# Patient Record
Sex: Female | Born: 1985 | Race: White | Hispanic: No | Marital: Married | Smoking: Current every day smoker
Health system: Southern US, Community
[De-identification: ages and names within clinical notes are randomized; demographics above are authoritative.]

## PROBLEM LIST (undated history)

## (undated) HISTORY — PX: OTHER SURGICAL HISTORY: SHX169

---

## 2016-11-20 ENCOUNTER — Emergency Department (HOSPITAL_COMMUNITY): Payer: No Typology Code available for payment source

## 2016-11-20 ENCOUNTER — Encounter (HOSPITAL_COMMUNITY): Payer: Self-pay | Admitting: Emergency Medicine

## 2016-11-20 DIAGNOSIS — R0789 Other chest pain: Secondary | ICD-10-CM | POA: Diagnosis not present

## 2016-11-20 DIAGNOSIS — R531 Weakness: Secondary | ICD-10-CM | POA: Insufficient documentation

## 2016-11-20 DIAGNOSIS — Z87891 Personal history of nicotine dependence: Secondary | ICD-10-CM | POA: Insufficient documentation

## 2016-11-20 DIAGNOSIS — R0602 Shortness of breath: Secondary | ICD-10-CM | POA: Diagnosis present

## 2016-11-20 DIAGNOSIS — M5412 Radiculopathy, cervical region: Secondary | ICD-10-CM | POA: Insufficient documentation

## 2016-11-20 NOTE — ED Triage Notes (Signed)
Pt states she was at [facility] about [facility] days ago and was diagnosed with Pneumonitis.Pt was sent home on prednisone. Pt reports not being able to use left arm and some swelling that she was already seen for. Pt reports SOB beginning last night and some facial swelling.

## 2016-11-21 ENCOUNTER — Emergency Department (HOSPITAL_COMMUNITY)
Admission: EM | Admit: 2016-11-21 | Discharge: 2016-11-21 | Disposition: A | Discharge status: Home / Self Care | Payer: No Typology Code available for payment source | Attending: Emergency Medicine | Admitting: Emergency Medicine

## 2016-11-21 ENCOUNTER — Encounter (HOSPITAL_COMMUNITY): Payer: Self-pay | Admitting: Radiology

## 2016-11-21 ENCOUNTER — Emergency Department (HOSPITAL_COMMUNITY): Payer: No Typology Code available for payment source

## 2016-11-21 DIAGNOSIS — M5412 Radiculopathy, cervical region: Secondary | ICD-10-CM

## 2016-11-21 DIAGNOSIS — R29898 Other symptoms and signs involving the musculoskeletal system: Secondary | ICD-10-CM

## 2016-11-21 DIAGNOSIS — R531 Weakness: Secondary | ICD-10-CM

## 2016-11-21 LAB — CBC
HCT: 43.5 % (ref 36.0–46.0)
Hemoglobin: 14.6 g/dL (ref 12.0–15.0)
MCH: 30.7 pg (ref 26.0–34.0)
MCHC: 33.6 g/dL (ref 30.0–36.0)
MCV: 91.6 fL (ref 78.0–100.0)
PLATELETS: 372 10*3/uL (ref 150–400)
RBC: 4.75 MIL/uL (ref 3.87–5.11)
RDW: 12.8 % (ref 11.5–15.5)
WBC: 19.2 10*3/uL — AB (ref 4.0–10.5)

## 2016-11-21 LAB — URINALYSIS, ROUTINE W REFLEX MICROSCOPIC
BACTERIA UA: NONE SEEN
BILIRUBIN URINE: NEGATIVE
Glucose, UA: NEGATIVE mg/dL
Hgb urine dipstick: NEGATIVE
KETONES UR: NEGATIVE mg/dL
Leukocytes, UA: NEGATIVE
Nitrite: POSITIVE — AB
PH: 7 (ref 5.0–8.0)
PROTEIN: NEGATIVE mg/dL
Specific Gravity, Urine: 1.01 (ref 1.005–1.030)

## 2016-11-21 LAB — I-STAT CHEM 8, ED
BUN: 14 mg/dL (ref 6–20)
CALCIUM ION: 1.08 mmol/L — AB (ref 1.15–1.40)
CHLORIDE: 104 mmol/L (ref 101–111)
Creatinine, Ser: 0.8 mg/dL (ref 0.44–1.00)
GLUCOSE: 102 mg/dL — AB (ref 65–99)
HCT: 45 % (ref 36.0–46.0)
Hemoglobin: 15.3 g/dL — ABNORMAL HIGH (ref 12.0–15.0)
Potassium: 5 mmol/L (ref 3.5–5.1)
Sodium: 140 mmol/L (ref 135–145)
TCO2: 28 mmol/L (ref 0–100)

## 2016-11-21 LAB — DIFFERENTIAL
BASOS PCT: 0 %
Basophils Absolute: 0 10*3/uL (ref 0.0–0.1)
EOS ABS: 0.1 10*3/uL (ref 0.0–0.7)
Eosinophils Relative: 0 %
Lymphocytes Relative: 25 %
Lymphs Abs: 4.9 10*3/uL — ABNORMAL HIGH (ref 0.7–4.0)
MONO ABS: 2.1 10*3/uL — AB (ref 0.1–1.0)
MONOS PCT: 11 %
Neutro Abs: 12.1 10*3/uL — ABNORMAL HIGH (ref 1.7–7.7)
Neutrophils Relative %: 64 %

## 2016-11-21 LAB — COMPREHENSIVE METABOLIC PANEL
ALT: 14 U/L (ref 14–54)
ANION GAP: 8 (ref 5–15)
AST: 13 U/L — ABNORMAL LOW (ref 15–41)
Albumin: 4.3 g/dL (ref 3.5–5.0)
Alkaline Phosphatase: 52 U/L (ref 38–126)
BUN: 11 mg/dL (ref 6–20)
CHLORIDE: 106 mmol/L (ref 101–111)
CO2: 24 mmol/L (ref 22–32)
Calcium: 8.9 mg/dL (ref 8.9–10.3)
Creatinine, Ser: 0.67 mg/dL (ref 0.44–1.00)
Glucose, Bld: 102 mg/dL — ABNORMAL HIGH (ref 65–99)
POTASSIUM: 3.6 mmol/L (ref 3.5–5.1)
SODIUM: 138 mmol/L (ref 135–145)
Total Bilirubin: 0.5 mg/dL (ref 0.3–1.2)
Total Protein: 7.4 g/dL (ref 6.5–8.1)

## 2016-11-21 LAB — I-STAT BETA HCG BLOOD, ED (MC, WL, AP ONLY): I-stat hCG, quantitative: <5 m[IU]/mL (ref <5)

## 2016-11-21 LAB — RAPID URINE DRUG SCREEN, HOSP PERFORMED
Amphetamines: NOT DETECTED
BENZODIAZEPINES: NOT DETECTED
Barbiturates: NOT DETECTED
COCAINE: NOT DETECTED
OPIATES: NOT DETECTED
Tetrahydrocannabinol: NOT DETECTED

## 2016-11-21 LAB — PROTIME-INR
INR: 0.97
PROTHROMBIN TIME: 12.9 s (ref 11.4–15.2)

## 2016-11-21 LAB — I-STAT TROPONIN, ED: TROPONIN I, POC: 0 ng/mL (ref 0.00–0.08)

## 2016-11-21 LAB — ETHANOL: Alcohol, Ethyl (B): <5 mg/dL (ref <5)

## 2016-11-21 LAB — APTT: APTT: 24 s (ref 24–36)

## 2016-11-21 MED ORDER — PREDNISONE 20 MG PO TABS: ORAL_TABLET | ORAL | 0 refills | Status: DC

## 2016-11-21 MED ORDER — NAPROXEN 500 MG PO TABS: 500.0000 mg | ORAL_TABLET | Freq: Two times a day (BID) | ORAL | 0 refills | Status: AC

## 2016-11-21 MED ORDER — LORAZEPAM 2 MG/ML IJ SOLN
1.0000 mg | Freq: Once | INTRAMUSCULAR | Status: AC
  Administered 2016-11-21: 1 mg via INTRAVENOUS
  Filled 2016-11-21: qty 1

## 2016-11-21 MED ORDER — OXYCODONE-ACETAMINOPHEN 5-325 MG PO TABS: 1.0000 | ORAL_TABLET | ORAL | 0 refills | Status: AC | PRN

## 2016-11-21 MED ORDER — IOPAMIDOL (ISOVUE-370) INJECTION 76%
100.0000 mL | Freq: Once | INTRAVENOUS | Status: AC | PRN
  Administered 2016-11-21: 100 mL via INTRAVENOUS

## 2016-11-21 MED ORDER — IOPAMIDOL (ISOVUE-370) INJECTION 76%
INTRAVENOUS | Status: AC
  Filled 2016-11-21: qty 100

## 2016-11-21 MED ORDER — GADOBENATE DIMEGLUMINE 529 MG/ML IV SOLN
20.0000 mL | Freq: Once | INTRAVENOUS | Status: AC | PRN
  Administered 2016-11-21: 20 mL via INTRAVENOUS

## 2016-11-21 NOTE — ED Notes (Signed)
Pt reports that she is c/o left sided chest pain and left side arm numbness that began on 11/17/16. Pt does report some SOB with Chest discomfort, and reports recently being dx with Pneumonia

## 2016-11-21 NOTE — ED Notes (Signed)
A Harris, PA, in w/pt and spouse.

## 2016-11-21 NOTE — ED Provider Notes (Signed)
Patient transferred [facility] for further evaluation of left upper extremity weakness after normal CT scan. Patient initially presented with complaints of shortness of breath.  She had associated left-sided neck pain described as cramping. 4 days ago she was seen around the hospital and diagnosed with pneumonitis. She was prescribed prednisone at that time which she reports taking as directed. She reports persistent symptoms and movement of her left upper chin when he increases pain. Patient denies trauma or falls.  Patient was evaluated by Dr. Mora Bellmanni who consulted with Dr. Roseanne RenoStewart who recommended emergent MRI tonight of her C-spine.  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 Patient will need admission for potential IVIG but will need MRI first to look for lesions.  Physical Exam  BP 128/90   Pulse 73   Temp 97.8 F (36.6 C)   Resp 13   Ht 5\' 8"  (1.727 m)   Wt 103.9 kg   LMP 11/15/2016   SpO2 98%   BMI 34.82 kg/m   Physical Exam  Constitutional: She appears well-developed and well-nourished. No distress.  HENT:  Head: Normocephalic.  Eyes: Conjunctivae are normal. No scleral icterus.  Neck: Normal range of motion.  Cardiovascular: Normal rate and intact distal pulses.   Pulmonary/Chest: Effort normal.  Musculoskeletal: Normal range of motion.  Mild edema of the left hand and forearm Significantly decreased range of motion of the left shoulder, elbow, wrist and fingers along with significant decreased strength  Neurological: She is alert.  Skin: Skin is warm and dry.    ED Course  Procedures  Clinical Course      MDM  Patient presents with acute worsening of her left arm weakness. She is barely able to move the arm on her own. She is pending emergent MRI of the C-spine to look for lesions.  She is currently nothing by mouth. Patient will need admission after MRI.  At shift change care was transferred to Huntley DecAbi Harris, PA-C who will follow pending studies,  re-evaulate and admit.          Dahlia ClientHannah Lianna Sitzmann, PA-C 11/21/16 [id number]    Derwood KaplanAnkit Nanavati, MD 11/21/16 [phone]

## 2016-11-21 NOTE — ED Notes (Signed)
Lab informed that new blue top is needed due to clotting.  Advised RN.

## 2016-11-21 NOTE — ED Notes (Signed)
[facility] here to [facility] for further evaluation and MRI

## 2016-11-21 NOTE — ED Provider Notes (Signed)
7:23 AM BP 110/86   Pulse (!) 59   Temp 97.7 F (36.5 C) (Oral)   Resp 16   Ht 5\' 8"  (1.727 m)   Wt 103.9 kg   LMP 11/15/2016   SpO2 96%   BMI 34.82 kg/m  Patient taken in sign out from PA Muthersbaugh. Patient recently diagnosed with pneumonitis. [facility] acute loss of function of her left arm. CTs negative. Case discussed with Dr. Roseanne RenoStewart of Neurology who request MRI w contrast of the cspine to r/o potential autoimmune  Brachial plexus abnormality for which she would require IVIG. Patient will NEED ADMISSION regardless of the findings, however the findings will direct mgmt. Medicine admission and Neuro consult.  I evaluated the patient along with Dr Denton LankSteinl The patient does not have flaccid paralysis of the left arm. When I woke the patient from sleep. She wiped her headache. I with her left hand. Patient also used her left arm and right arm to push herself up to seated position. Asked for strength evaluation. The patient is very poor effort, however, if you lift her arm and told her to hold it up. The patient is able to utilize the arm normally. However, she complains of pain. Patient's strength is closer to 3 or 4 out of 5. Her MRI of the spine shows a left sided cervical compression at C5-C6 nerve root. I discussed the case with Dr. 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 Otelia LimesLindzen also feels comfortable with this diagnosis. In further questioning of the patient. She states that when this first started, she felt as if she could not move her arm, but after several days of steroid use. Patient has had improvement in motor function. Patient feels that if she could just control her pain. She would be okay. I discussed the case also with Dr. Conchita ParisNundkumar agrees with the assessment. He asked for a two-week steroid taper along with NSAIDs. I'll also give the  patient is short amount of narcotic pain medicine, given her significant radicular pain. Patient is comfortable with the plan of care at this time and will be discharged with these medications and a follow-up with neurosurgery  Patient seen in shared visit with attending physician, Cathren LaineKevin Steinl, MD Who agrees with assessment, work up , treatment, and plan for discharge. I have answered all questions to the best of my ability and discussed with her return precautions with the patient.     Arthor Captainbigail Mkayla Steele, PA-C 11/21/16 1616    Cathren LaineKevin Steinl, MD 11/24/16 [phone]

## 2016-11-21 NOTE — ED Notes (Signed)
Pt transported to MRI 

## 2016-11-21 NOTE — Discharge Instructions (Signed)
Get help right away if: Your pain gets much worse and cannot be controlled with medicines. You have weakness or numbness in your hand, arm, face, or leg. You have a high fever. You have a stiff, rigid neck. You lose control of your bowels or your bladder (have incontinence). You have trouble with walking, balance, or speaking. 

## 2016-11-21 NOTE — ED Notes (Signed)
Pt and significant other lying on stretcher. Woke pt to advise transporter ready to transport her to MRI.

## 2016-11-21 NOTE — ED Provider Notes (Signed)
[facility]-EMERGENCY DEPT Provider Note    By signing my name below, I, Earmon Phoenix, attest that this documentation has been prepared under the direction and in the presence of Tomasita Crumble, MD. Electronically Signed: Earmon Phoenix, ED Scribe. 11/21/16. 2:47 AM.    History   Chief Complaint Chief Complaint  Patient presents with  . Shortness of Breath    The history is provided by the patient and medical records. No language interpreter was used.    HPI Comments:  Roberta Hughes is an obese 30 y.o. female who presents to the Emergency Department complaining of SOB that began last night. She reports associated LUE pain, swelling and facial swelling. Pt reports her symptoms started with gradually worsening left sided neck pain she describes as cramping. She states she was seen at [facility] four days ago and was diagnosed with pneumonitis and was prescribed Prednisone which she reports taking as directed. Pt reports the steroids are not helping and she is still having difficulty moving her LUE. Moving the LUE increases her pain. She denies fever, chills, nausea, vomiting, diarrhea, difficulty urinating. She denies any trauma, injury or fall.    History reviewed. No pertinent past medical history.  There are no active problems to display for this patient.   Past Surgical History:  Procedure Laterality Date  . arm surgery Left    Nerve blockage    OB History    No data available       Home Medications    Prior to Admission medications   Medication Sig Start Date End Date Taking? Authorizing Provider  ibuprofen (ADVIL,MOTRIN) 200 MG tablet Take 400 mg by mouth every 6 (six) hours as needed for headache, mild pain or moderate pain.   Yes Historical Provider, MD  predniSONE (DELTASONE) 50 MG tablet Take 50 mg by mouth daily with breakfast.   Yes Historical Provider, MD    Family History No family history on file.  Social History Social History  Substance Use Topics    . Smoking status: Former Smoker    Quit date: 11/16/2016  . Smokeless tobacco: Never Used  . Alcohol use Not on file     Allergies   Patient has no known allergies.   Review of Systems Review of Systems A complete 10 system review of systems was obtained and all systems are negative except as noted in the HPI and PMH.    Physical Exam Updated Vital Signs BP 144/90 (BP Location: Left Arm)   Pulse 82   Temp 97.8 F (36.6 C) (Oral)   Resp 18   Ht 5\' 8"  (1.727 m)   Wt 229 lb (103.9 kg)   LMP 11/15/2016   SpO2 100%   BMI 34.82 kg/m   Physical Exam  Constitutional: She is oriented to person, place, and time. She appears well-developed and well-nourished. No distress.  HENT:  Head: Normocephalic and atraumatic.  Nose: Nose normal.  Mouth/Throat: Oropharynx is clear and moist. No oropharyngeal exudate.  Eyes: Conjunctivae and EOM are normal. Pupils are equal, round, and reactive to light. No scleral icterus.  Neck: Normal range of motion. Neck supple. No JVD present. No tracheal deviation present. No thyromegaly present.  Cardiovascular: Normal rate, regular rhythm and normal heart sounds.  Exam reveals no gallop and no friction rub.   No murmur heard. Pulmonary/Chest: Effort normal and breath sounds normal. No respiratory distress. She has no wheezes. She exhibits no tenderness.  Abdominal: Soft. Bowel sounds are normal. She exhibits no distension and no  mass. There is no tenderness. There is no rebound and no guarding.  Musculoskeletal: She exhibits tenderness. She exhibits no edema.  Tenderness to palpation over left neck and left anterior chest.  Lymphadenopathy:    She has no cervical adenopathy.  Neurological: She is alert and oriented to person, place, and time. No cranial nerve deficit. She exhibits normal muscle tone.  LUE 1/5 strength. 5/5 strength in all other extremities. Normal sensations. Slurred speech.  Skin: Skin is warm and dry. No rash noted. No erythema.  No pallor.  Nursing note and vitals reviewed.    ED Treatments / Results  DIAGNOSTIC STUDIES: Oxygen Saturation is 100% on RA, normal by my interpretation.   COORDINATION OF CARE: 12:21 AM- Will order labs and imaging. Pt verbalizes understanding and agrees to plan.  Medications  iopamidol (ISOVUE-370) 76 % injection (not administered)  iopamidol (ISOVUE-370) 76 % injection 100 mL (100 mLs Intravenous Contrast Given 11/21/16 0148)    Labs (all labs ordered are listed, but only abnormal results are displayed) Labs Reviewed  CBC - Abnormal; Notable for the following:       Result Value   WBC 19.2 (*)    All other components within normal limits  DIFFERENTIAL - Abnormal; Notable for the following:    Neutro Abs 12.1 (*)    Lymphs Abs 4.9 (*)    Monocytes Absolute 2.1 (*)    All other components within normal limits  COMPREHENSIVE METABOLIC PANEL - Abnormal; Notable for the following:    Glucose, Bld 102 (*)    AST 13 (*)    All other components within normal limits  URINALYSIS, ROUTINE W REFLEX MICROSCOPIC - Abnormal; Notable for the following:    APPearance HAZY (*)    Nitrite POSITIVE (*)    Squamous Epithelial / LPF 0-5 (*)    All other components within normal limits  I-STAT CHEM 8, ED - Abnormal; Notable for the following:    Glucose, Bld 102 (*)    Calcium, Ion 1.08 (*)    Hemoglobin 15.3 (*)    All other components within normal limits  ETHANOL  RAPID URINE DRUG SCREEN, HOSP PERFORMED  PROTIME-INR  APTT  I-STAT TROPOININ, ED  I-STAT BETA HCG BLOOD, ED ([facility], [facility], [facility] ONLY)    EKG  EKG Interpretation  Date/Time:  Saturday November 20 2016 19:43:53 EST Ventricular Rate:  74 PR Interval:    QRS Duration: 81 QT Interval:  366 QTC Calculation: 406 R Axis:   46 Text Interpretation:  Sinus rhythm normal, no old comparison Confirmed by Donnald GarrePfeiffer, MD, Lebron ConnersMarcy [phone]([id number]) on 11/20/2016 8:58:59 PM       Radiology Dg Chest 2 View  Result Date: 11/20/2016 CLINICAL  DATA:  Recent diagnosis of pneumonia. Shortness of breath which is worsening. Facial swelling. EXAM: CHEST  2 VIEW COMPARISON:  None. FINDINGS: The heart size and mediastinal contours are within normal limits. Both lungs are clear. The visualized skeletal structures are unremarkable. IMPRESSION: No active cardiopulmonary disease. Electronically Signed   By: Paulina FusiMark  Shogry M.D.   On: 11/20/2016 20:00   Ct Head Wo Contrast  Result Date: 11/21/2016 CLINICAL DATA:  Shortness of breath and facial swelling beginning last night, LEFT neck cramping. LEFT upper extremity weakness. EXAM: CT HEAD WITHOUT CONTRAST TECHNIQUE: Contiguous axial images were obtained from the base of the skull through the vertex without intravenous contrast. COMPARISON:  None. FINDINGS: BRAIN: The ventricles and sulci are normal. No intraparenchymal hemorrhage, mass effect nor midline shift. No acute large  vascular territory infarcts. No abnormal extra-axial fluid collections. Basal cisterns are patent. VASCULAR: Unremarkable. SKULL/SOFT TISSUES: No skull fracture. No significant soft tissue swelling. ORBITS/SINUSES: The included ocular globes and orbital contents are normal.Severe RIGHT maxillary sinusitis without antral expansion. OTHER: None. IMPRESSION: Normal CT HEAD. Severe RIGHT maxillary sinusitis. Electronically Signed   By: Awilda Metro M.D.   On: 11/21/2016 01:02   Ct Angio Neck W Or Wo Contrast  Result Date: 11/21/2016 CLINICAL DATA:  LEFT neck pain and LEFT upper extremity weakness. EXAM: CT ANGIOGRAPHY NECK TECHNIQUE: Multidetector CT imaging of the neck was performed using the standard protocol during bolus administration of intravenous contrast. Multiplanar CT image reconstructions and MIPs were obtained to evaluate the vascular anatomy. Carotid stenosis measurements (when applicable) are obtained utilizing NASCET criteria, using the distal internal carotid diameter as the denominator. CONTRAST:  100 cc Isovue 370  COMPARISON:  None. FINDINGS: AORTIC ARCH: Normal appearance of the thoracic arch, normal branch pattern. The origins of the innominate, left Common carotid artery and subclavian artery are widely patent. RIGHT CAROTID SYSTEM: Common carotid artery is widely patent, coursing in a straight line fashion. Normal appearance of the carotid bifurcation without hemodynamically significant stenosis by NASCET criteria. Normal appearance of the included internal carotid artery. LEFT CAROTID SYSTEM: Common carotid artery is widely patent, coursing in a straight line fashion. Normal appearance of the carotid bifurcation without hemodynamically significant stenosis by NASCET criteria. Normal appearance of the included internal carotid artery. VERTEBRAL ARTERIES:Codominant vertebral artery's. Normal appearance of the vertebral arteries, which appear widely patent. RIGHT vertebral artery remains extraforaminal dishes C4-5, normal variant. SKELETON: No acute osseous process though bone windows have not been submitted. No degenerative change of the cervical spine. OTHER NECK: Soft tissues of the neck are nonacute though, not tailored for evaluation. Please see CT chest report from same day, reported separately for dedicated findings. 16 mm LEFT thyroid nodule. IMPRESSION: Normal CTA NECK. **An incidental finding of potential clinical significance has been found. 19 mm LEFT thyroid nodule for which nonemergent thyroid sonogram is recommended. This follows ACR consensus guidelines: Managing Incidental Thyroid Nodules Detected on Imaging: White Paper of the ACR Incidental Thyroid Findings Committee. J Am Coll Radiol 2015; 12:143-150.** Electronically Signed   By: Awilda Metro M.D.   On: 11/21/2016 02:19   Ct Chest W Contrast  Result Date: 11/21/2016 CLINICAL DATA:  Neck pain and left upper extremity weakness. EXAM: CT CHEST WITH CONTRAST TECHNIQUE: Multidetector CT imaging of the chest was performed during intravenous contrast  administration. CONTRAST:  100 cc Isovue 370, contrast bolus utilized for both neck CTA and chest CT. COMPARISON:  Chest CT 4 days prior 11/17/2016 performed at [facility]. FINDINGS: Cardiovascular: Borderline cardiomegaly again seen. No evidence of aortic dissection allowing for motion. No pericardial fluid. Mediastinum/Nodes: The bilateral hilar adenopathy on prior exam is not as well visualized, due to phase of contrast bolus timing. Persistent prominent right hilar lymph node measures 11 mm. The left hilar lymph nodes on prior exam are not as well visualized. Small subcarinal lymph node is seen. Left thyroid nodule, as reported previously. Lungs/Pleura: Diminished left upper lobe ground-glass opacity from prior exam. Minimal residual right middle lobe atelectasis or scarring. The lungs are otherwise clear. No pleural effusion. Upper Abdomen: No acute abnormality. Musculoskeletal: There are no acute or suspicious osseous abnormalities. IMPRESSION: 1. Resolved left upper lobe ground-glass opacity consistent with improved pneumonitis. 2. Mild persistent right hilar adenopathy. Prominent left hilar lymph nodes on prior exam are not as  well visualized, may have resolved or just not seen due to phase of contrast. Electronically Signed   By: Rubye OaksMelanie  Ehinger M.D.   On: 11/21/2016 02:33    Procedures Procedures (including critical care time)  Medications Ordered in ED Medications  iopamidol (ISOVUE-370) 76 % injection (not administered)  iopamidol (ISOVUE-370) 76 % injection 100 mL (100 mLs Intravenous Contrast Given 11/21/16 0148)     Initial Impression / Assessment and Plan / ED Course  I have reviewed the triage vital signs and the nursing notes.  Pertinent labs & imaging results that were available during my care of the patient were reviewed by me and considered in my medical decision making (see chart for details).  Clinical Course    Patient presents to the ED for numbness and weakness  of the LUE.  Also has pain in the L chest and neck.  This has been going on for 2-3 days.  Concerning for mass in chest or neck arterial dissection.  Will investigate with CT.  Patient will need admission for further management.   3:33 AM Labs and CT normal and do not show cause of weakness.  She continues to be weak in that extremity.  I consulted with Dr. Roseanne RenoStewart who recommends the patient get an MRI of the c spine tonight [facility]. She will likely require admission for further care after MRI.  Dr. Adela LankFloyd accepts to [facility].   I personally performed the services described in this documentation, which was scribed in my presence. The recorded information has been reviewed and is accurate.     Final Clinical Impressions(s) / ED Diagnoses   Final diagnoses:  None    New Prescriptions New Prescriptions   No medications on file     Tomasita CrumbleAdeleke De Libman, MD 11/21/16 [id number]

## 2017-03-14 ENCOUNTER — Emergency Department (HOSPITAL_COMMUNITY)
Admission: EM | Admit: 2017-03-14 | Discharge: 2017-03-14 | Disposition: A | Discharge status: Home / Self Care | Payer: No Typology Code available for payment source | Attending: Emergency Medicine | Admitting: Emergency Medicine

## 2017-03-14 ENCOUNTER — Encounter (HOSPITAL_COMMUNITY): Payer: Self-pay

## 2017-03-14 DIAGNOSIS — N939 Abnormal uterine and vaginal bleeding, unspecified: Secondary | ICD-10-CM | POA: Diagnosis present

## 2017-03-14 DIAGNOSIS — F17200 Nicotine dependence, unspecified, uncomplicated: Secondary | ICD-10-CM | POA: Insufficient documentation

## 2017-03-14 LAB — CBC WITH DIFFERENTIAL/PLATELET
Basophils Absolute: 0.1 10*3/uL (ref 0.0–0.1)
Basophils Relative: 1 %
Eosinophils Absolute: 0.3 10*3/uL (ref 0.0–0.7)
Eosinophils Relative: 4 %
HEMATOCRIT: 37.3 % (ref 36.0–46.0)
HEMOGLOBIN: 12.1 g/dL (ref 12.0–15.0)
LYMPHS PCT: 30 %
Lymphs Abs: 2.5 10*3/uL (ref 0.7–4.0)
MCH: 30.2 pg (ref 26.0–34.0)
MCHC: 32.4 g/dL (ref 30.0–36.0)
MCV: 93 fL (ref 78.0–100.0)
MONOS PCT: 9 %
Monocytes Absolute: 0.8 10*3/uL (ref 0.1–1.0)
NEUTROS ABS: 4.9 10*3/uL (ref 1.7–7.7)
NEUTROS PCT: 56 %
Platelets: 293 10*3/uL (ref 150–400)
RBC: 4.01 MIL/uL (ref 3.87–5.11)
RDW: 12.9 % (ref 11.5–15.5)
WBC: 8.6 10*3/uL (ref 4.0–10.5)

## 2017-03-14 LAB — COMPREHENSIVE METABOLIC PANEL
ALBUMIN: 3.7 g/dL (ref 3.5–5.0)
ALK PHOS: 43 U/L (ref 38–126)
ALT: 19 U/L (ref 14–54)
ANION GAP: 7 (ref 5–15)
AST: 18 U/L (ref 15–41)
BILIRUBIN TOTAL: 0.3 mg/dL (ref 0.3–1.2)
BUN: 8 mg/dL (ref 6–20)
CALCIUM: 9.2 mg/dL (ref 8.9–10.3)
CO2: 27 mmol/L (ref 22–32)
Chloride: 105 mmol/L (ref 101–111)
Creatinine, Ser: 0.77 mg/dL (ref 0.44–1.00)
GFR calc Af Amer: >60 mL/min (ref >60)
GLUCOSE: 83 mg/dL (ref 65–99)
POTASSIUM: 4.2 mmol/L (ref 3.5–5.1)
Sodium: 139 mmol/L (ref 135–145)
TOTAL PROTEIN: 5.9 g/dL — AB (ref 6.5–8.1)

## 2017-03-14 LAB — LIPASE, BLOOD: LIPASE: 36 U/L (ref 11–51)

## 2017-03-14 LAB — I-STAT BETA HCG BLOOD, ED (MC, WL, AP ONLY): I-stat hCG, quantitative: <5 m[IU]/mL (ref <5)

## 2017-03-14 NOTE — ED Triage Notes (Signed)
Pt states she has lower abdominal pain and heavy vaginal bleeding X6 days. LMP 02/03/17. She reports she is passing quarter size clots and is having to change her pad X2 per hour.

## 2017-03-14 NOTE — Discharge Instructions (Signed)
You may continue taking Tylenol and/or ibuprofen as prescribed over-the-counter as needed for pain relief. Continue draining fluids at home to remain hydrated. I recommend following up with the women's clinic listed below this week for further management and evaluation of your abnormal vaginal bleeding. Please return to the Emergency Department if symptoms worsen or new onset of fever, chest pain, difficulty breathing, new/worsening abdominal pain, vomiting, worsening vaginal bleeding, headache, lightheadedness, dizziness, numbness, weakness, syncope.

## 2017-03-14 NOTE — ED Provider Notes (Signed)
[facility]-EMERGENCY DEPT Provider Note   CSN: [id number] Arrival date & time: 03/14/17  1047     History   Chief Complaint Chief Complaint  Patient presents with  . Vaginal Bleeding    HPI Roberta Hughes is a 31 y.o. female.  HPI   Patient is a 31 year old female with no pertinent past medical history presents the ED with complaint of vaginal bleeding, onset 6 days. Patient reports having constant heavy vaginal bleeding for the past 6 days, endorses passing multiple clots daily. She states she is having to change her pad every 30-60 minutes. She endorses having associated waxing and waning lower abdominal sharp pain which started 2 days ago. Denies any aggravating or alleviating factors. She states she has been taking ibuprofen at home without relief. Patient reports history of irregular menstrual cycles but notes her last menstrual period was on 02/03/17. Denies currently being on any form of birth control. Denies fever, chills, headache, lightheadedness, shortness of breath, chest pain, nausea, vomiting, diarrhea, urinary symptoms, vaginal discharge, numbness, weakness, syncope. Denies abdominal surgical history.  History reviewed. No pertinent past medical history.  There are no active problems to display for this patient.   Past Surgical History:  Procedure Laterality Date  . arm surgery Left    Nerve blockage    OB History    No data available       Home Medications    Prior to Admission medications   Medication Sig Start Date End Date Taking? Authorizing Provider  ibuprofen (ADVIL,MOTRIN) 200 MG tablet Take 400 mg by mouth every 6 (six) hours as needed for headache, mild pain or moderate pain.    Historical Provider, MD  naproxen (NAPROSYN) 500 MG tablet Take 1 tablet (500 mg total) by mouth 2 (two) times daily. 11/21/16   Arthor Captain, PA-C  oxyCODONE-acetaminophen (PERCOCET) 5-325 MG tablet Take 1 tablet by mouth every 4 (four) hours as needed. 11/21/16   Arthor Captain,  PA-C  predniSONE (DELTASONE) 20 MG tablet 3 tabs po daily x 3 days, then 2 tabs x 3 days, then 1.5 tabs x 3 days, then 1 tab x 3 days, then 0.5 tabs x 3 days 11/21/16   Arthor Captain, PA-C    Family History History reviewed. No pertinent family history.  Social History Social History  Substance Use Topics  . Smoking status: Current Every Day Smoker    Packs/day: 0.50    Last attempt to quit: 11/16/2016  . Smokeless tobacco: Never Used  . Alcohol use Yes     Comment: occ     Allergies   Patient has no known allergies.   Review of Systems Review of Systems  Gastrointestinal: Positive for abdominal pain.  Genitourinary: Positive for vaginal bleeding.  All other systems reviewed and are negative.    Physical Exam Updated Vital Signs BP 113/78   Pulse 87   Temp 98.4 F (36.9 C) (Oral)   Resp 18   SpO2 98%   Physical Exam  Constitutional: She is oriented to person, place, and time. She appears well-developed and well-nourished. No distress.  HENT:  Head: Normocephalic and atraumatic.  Mouth/Throat: Oropharynx is clear and moist. No oropharyngeal exudate.  Eyes: Conjunctivae and EOM are normal. Right eye exhibits no discharge. Left eye exhibits no discharge. No scleral icterus.  Neck: Normal range of motion. Neck supple.  Cardiovascular: Normal rate, regular rhythm, normal heart sounds and intact distal pulses.   Pulmonary/Chest: Effort normal and breath sounds normal. No respiratory distress. She has no  wheezes. She has no rales. She exhibits no tenderness.  Abdominal: Soft. Normal appearance and bowel sounds are normal. She exhibits no distension and no mass. There is tenderness. There is no rigidity, no rebound, no guarding and no CVA tenderness. No hernia.  Mild TTP over lower abdominal quadrants  Musculoskeletal: Normal range of motion. She exhibits no edema.  Neurological: She is alert and oriented to person, place, and time.  Skin: Skin is warm and dry. She is  not diaphoretic.  Nursing note and vitals reviewed.  Pelvic exam: normal external genitalia, vulva, vagina, cervix, uterus and adnexa, VULVA: normal appearing vulva with no masses, tenderness or lesions, VAGINA: normal appearing vagina with normal color and discharge, no lesions, small amount of blood present in vaginal vault, CERVIX: normal appearing cervix without discharge or lesions, multiparous os, small amount of blood slowly oozing from cervical os, UTERUS: uterus is normal size, shape, consistency and nontender, ADNEXA: normal adnexa in size, nontender and no masses, exam chaperoned by female nurse.   ED Treatments / Results  Labs (all labs ordered are listed, but only abnormal results are displayed) Labs Reviewed  COMPREHENSIVE METABOLIC PANEL - Abnormal; Notable for the following:       Result Value   Total Protein 5.9 (*)    All other components within normal limits  CBC WITH DIFFERENTIAL/PLATELET  LIPASE, BLOOD  I-STAT BETA HCG BLOOD, ED ([facility], [facility], [facility] ONLY)    EKG  EKG Interpretation None       Radiology No results found.  Procedures Procedures (including critical care time)  Medications Ordered in ED Medications - No data to display   Initial Impression / Assessment and Plan / ED Course  I have reviewed the triage vital signs and the nursing notes.  Pertinent labs & imaging results that were available during my care of the patient were reviewed by me and considered in my medical decision making (see chart for details).     Patient presents with vaginal bleeding with associated lower abdominal pain for the past 6 days. Reports history of irregular menstrual cycles, denies use of birth control. VSS. Exam revealed mild tenderness over lower abdominal quadrants, no peritoneal signs. Pelvic exam revealed small amount of blood present in vaginal vault, slow oozing blood present from cervical os, remaining exam unremarkable, no CMT or adnexal tenderness. Pregnancy  negative. Labs unremarkable, hemoglobin 12.1. Patient has remained hemodynamically stable while in the ED. Plan to discharge patient home with outpatient gynecology follow-up for abnormal uterine bleeding and possible need for pelvic US for further evaluation. Discussed results and plan for discharge with patient. Discussed return precautions.  Final Clinical Impressions(s) / ED Diagnoses   Final diagnoses:  Abnormal uterine bleeding    New Prescriptions New Prescriptions   No medications on file     Barrett Henle, PA-C 03/14/17 1409    Geoffery Lyons, MD 03/15/17 2258

## 2018-02-04 IMAGING — CT CT HEAD W/O CM
3 of 4 series · 15 of 47 positions shown, 18 images · non-contrast
Comparison: None.

CLINICAL DATA: Shortness of breath and facial swelling beginning
last night, LEFT neck cramping. LEFT upper extremity weakness.

EXAM:
CT HEAD WITHOUT CONTRAST
TECHNIQUE: Contiguous axial images were obtained from the base of the skull
through the vertex without intravenous contrast.

[Series 2: head w/o · axial · non-contrast · 0.45mm/px · z∈[-183,-63]mm · 9 of 29 slices shown, 12 images]
[im 3/29  brain]
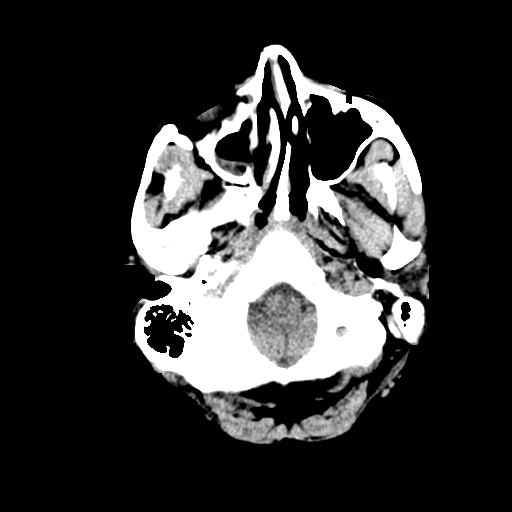
[im 3/29  bone]
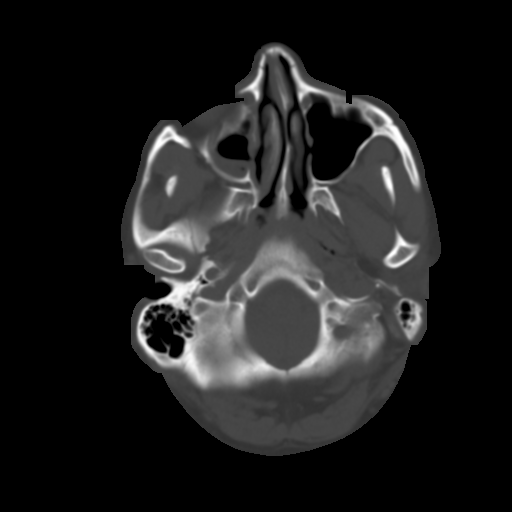
[im 7/29  brain]
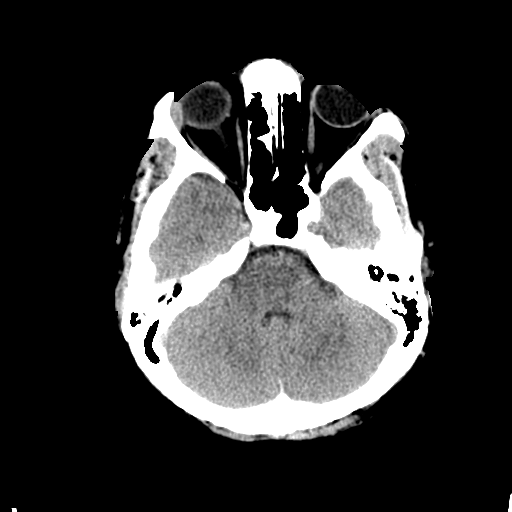
[im 9/29  brain]
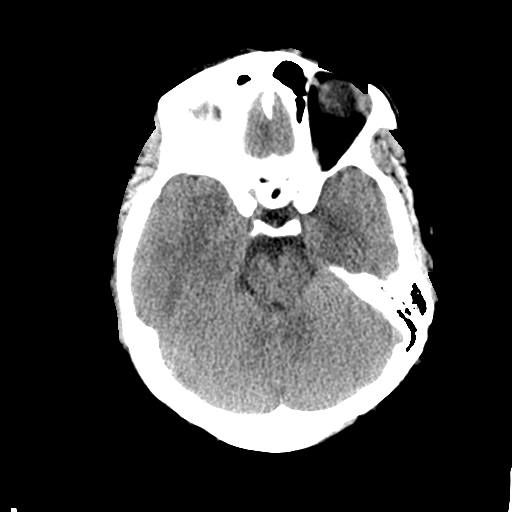
[im 13/29  brain]
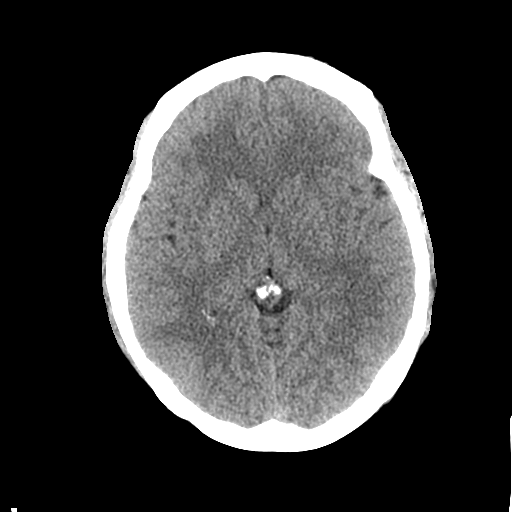
[im 15/29  brain]
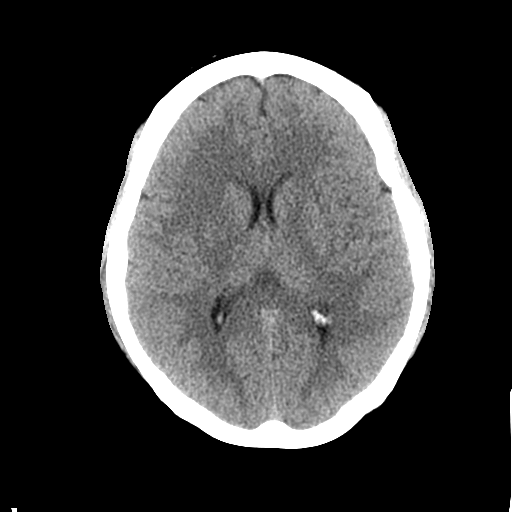
[im 15/29  bone]
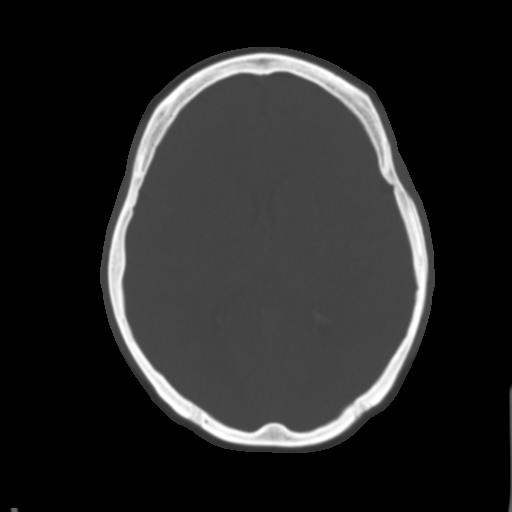
[im 17/29  brain]
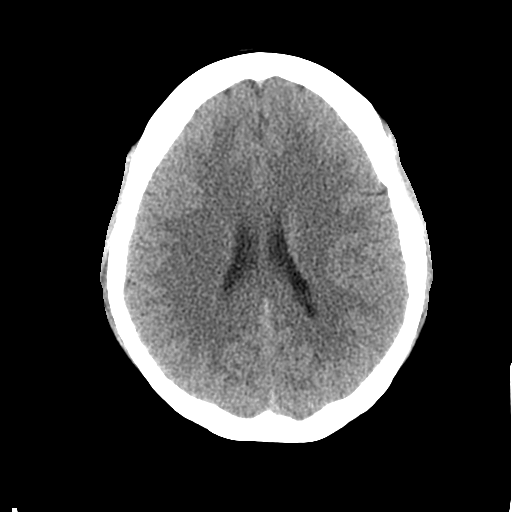
[im 21/29  brain]
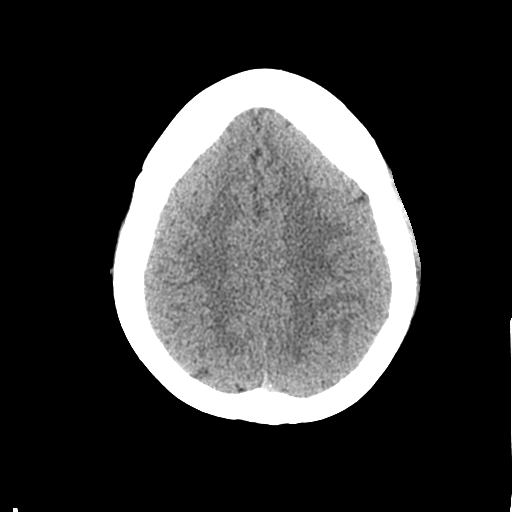
[im 23/29  brain]
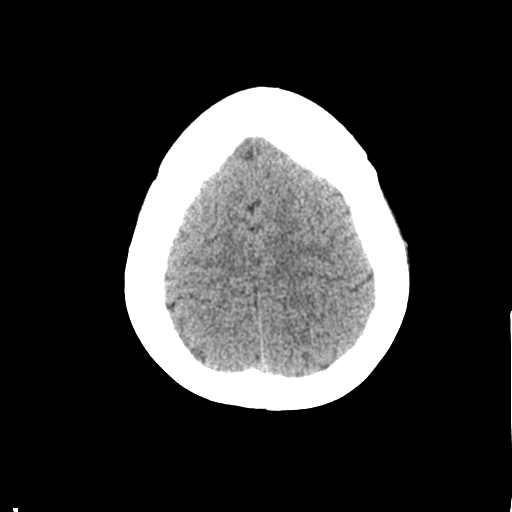
[im 27/29  brain]
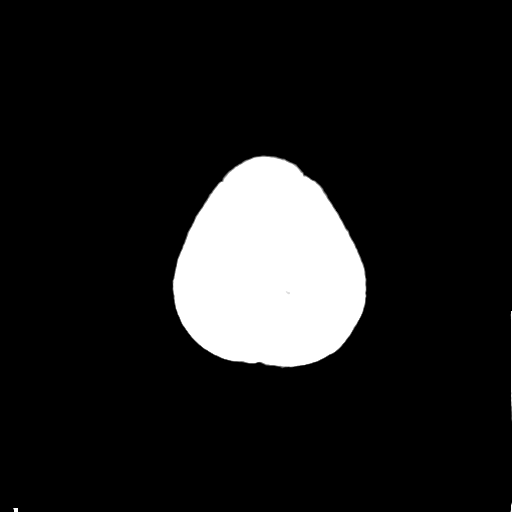
[im 27/29  bone]
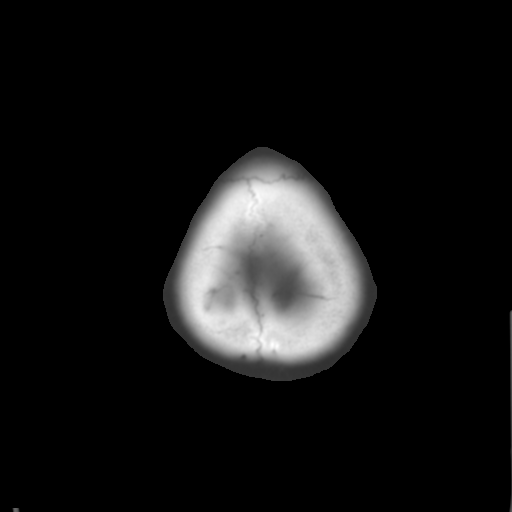

[Series 5: coronal · coronal · 0.29mm/px · 3 of 59 slices shown]
[im 20/59  brain]
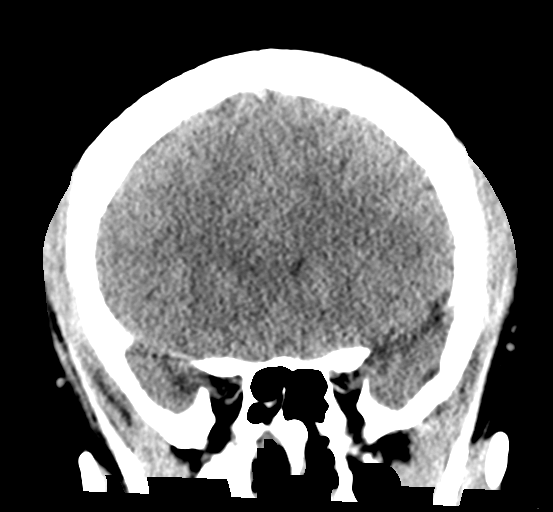
[im 26/59  brain]
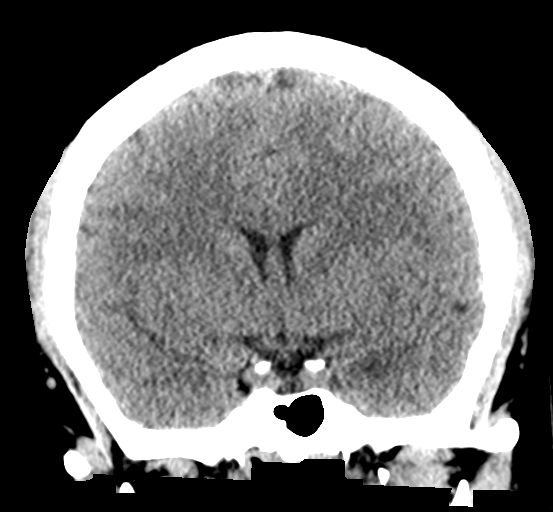
[im 33/59  brain]
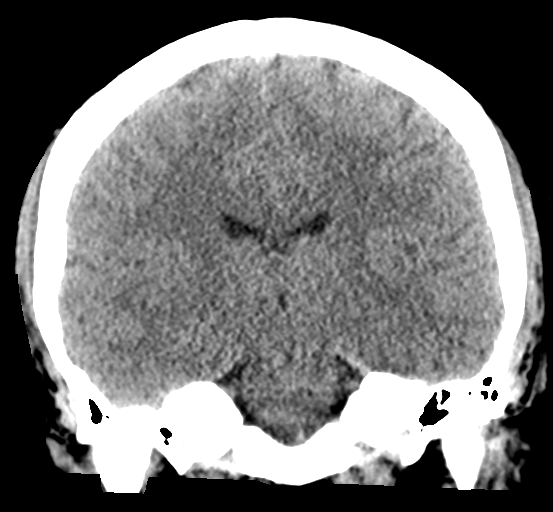

[Series 6: sagittal · sagittal · 0.29mm/px · 3 of 49 slices shown]
[im 17/49  brain]
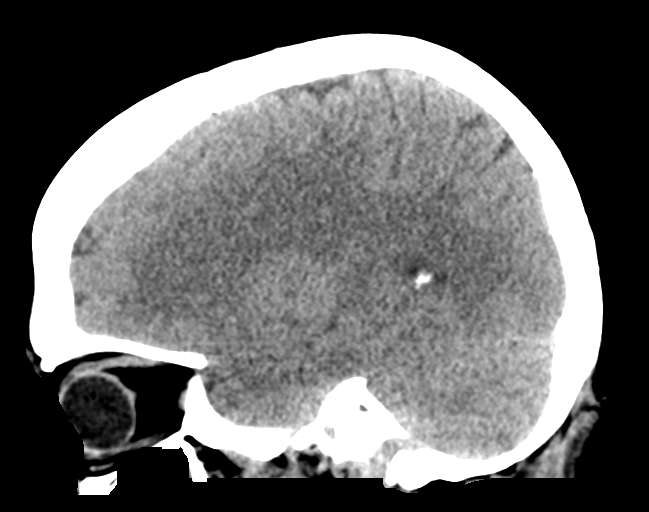
[im 25/49  brain]
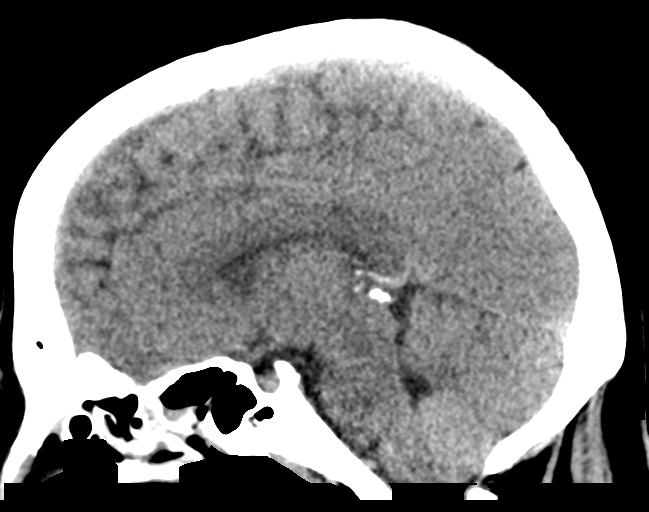
[im 33/49  brain]
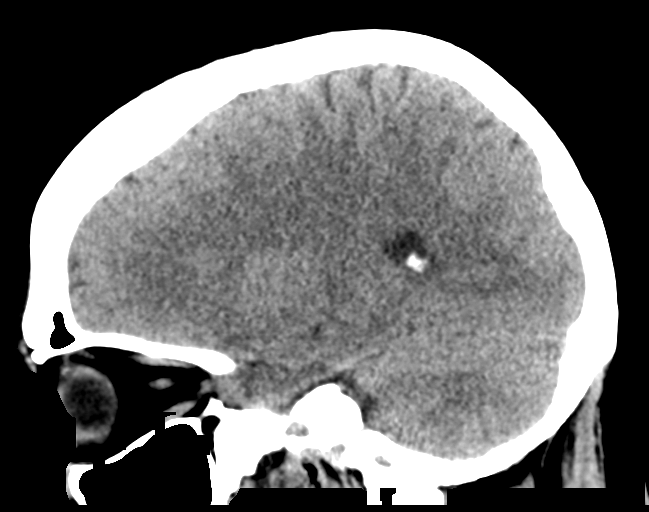

[15 of 47 positions shown; findings below may reference images not displayed]

FINDINGS: BRAIN: The ventricles and sulci are normal. No intraparenchymal
hemorrhage, mass effect nor midline shift. No acute large vascular
territory infarcts. No abnormal extra-axial fluid collections. Basal
cisterns are patent.

VASCULAR: Unremarkable.

SKULL/SOFT TISSUES: No skull fracture. No significant soft tissue
swelling.

ORBITS/SINUSES: The included ocular globes and orbital contents are
normal.Severe RIGHT maxillary sinusitis without antral expansion.

OTHER: None.
IMPRESSION: Normal CT HEAD.

Severe RIGHT maxillary sinusitis.

## 2018-02-04 IMAGING — MR MR CERVICAL SPINE WO/W CM
4 of 9 series · 19 of 48 positions shown · IV contrast (multihance)
Comparison: CT scan of the cervical spine dated 11/21/2016

CLINICAL DATA: Left-sided neck pain and left upper extremity pain,
swelling, and difficulty moving the left arm. Left arm weakness.

EXAM:
MRI CERVICAL SPINE WITHOUT AND WITH CONTRAST
TECHNIQUE: Multiplanar and multiecho pulse sequences of the cervical spine, to
include the craniocervical junction and cervicothoracic junction,
were obtained without and with intravenous contrast.
CONTRAST:  20mL MULTIHANCE GADOBENATE DIMEGLUMINE 529 MG/ML IV SOLN

[Series 2: T2 · sagittal · 3.0mm · 0.43mm/px · 4 of 15 slices shown (1 of 2)]
[im 1/15]
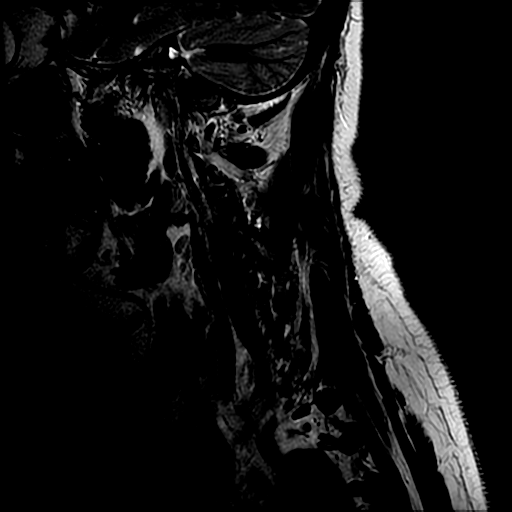
[im 5/15]
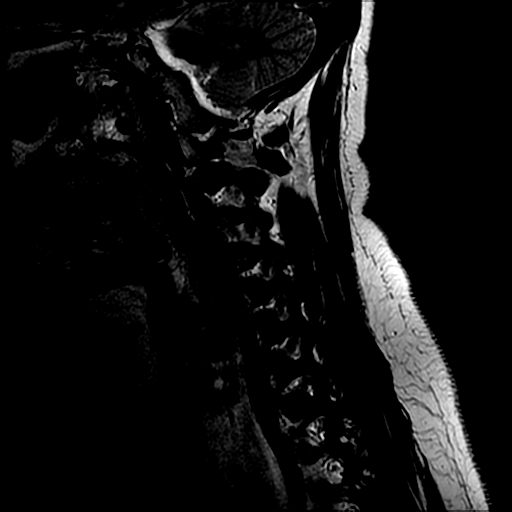
[im 10/15]
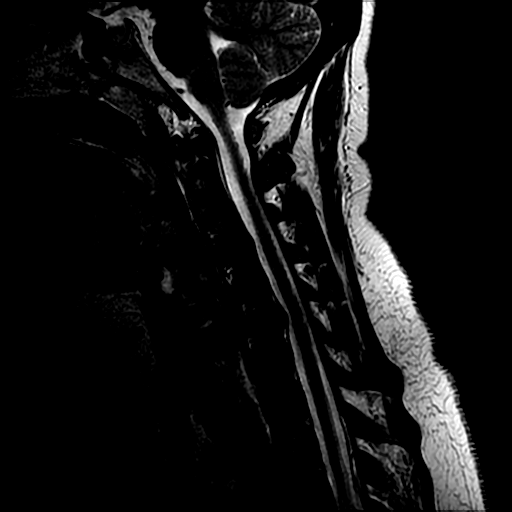
[im 15/15]
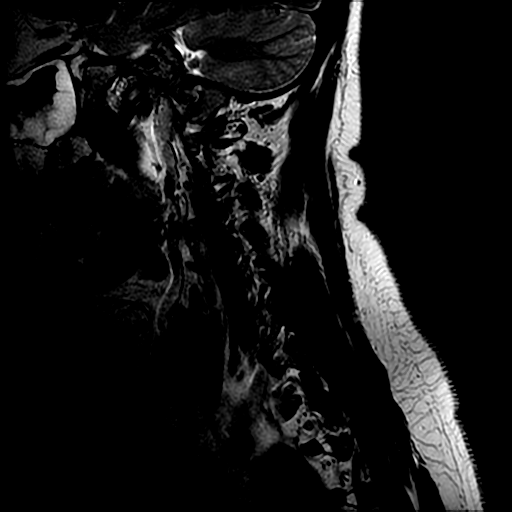

[Series 3: T1 · sagittal · 3.0mm · 0.43mm/px · 3 of 15 slices shown (1 of 2)]
[im 1/15]
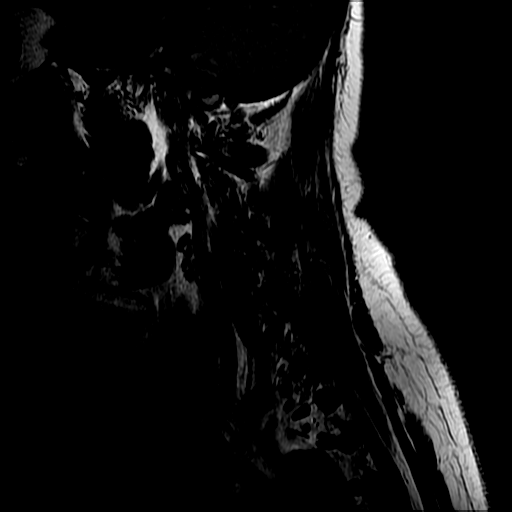
[im 8/15]
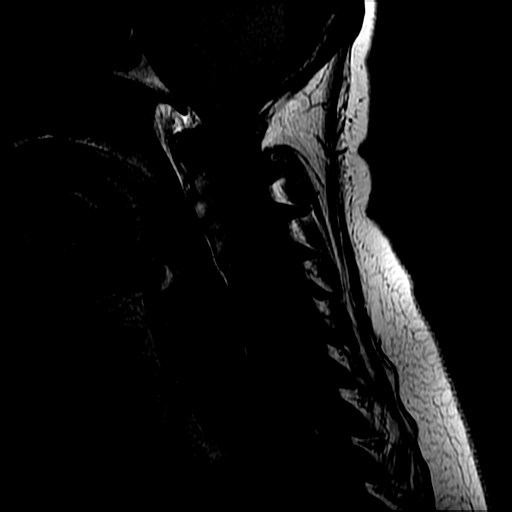
[im 15/15]
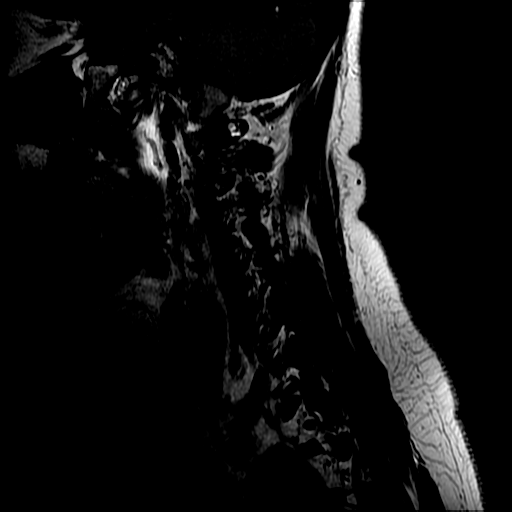

[Series 5: T2 · axial · 3.0mm · 0.39mm/px · z∈[-92,+21]mm · 8 of 35 slices shown (2 of 2)]
[im 1/35]
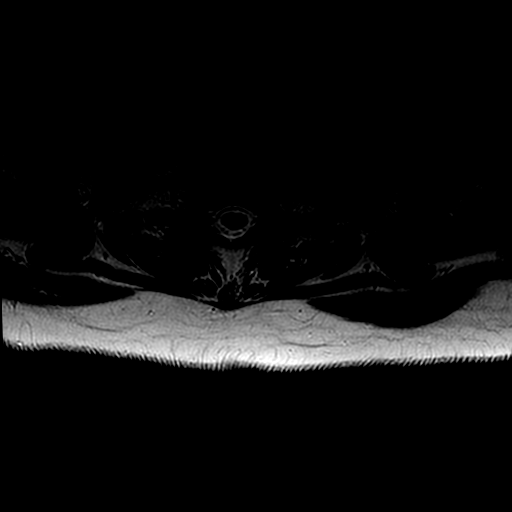
[im 5/35]
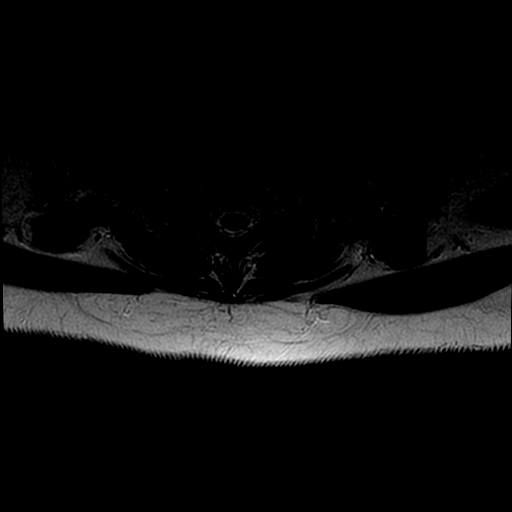
[im 10/35]
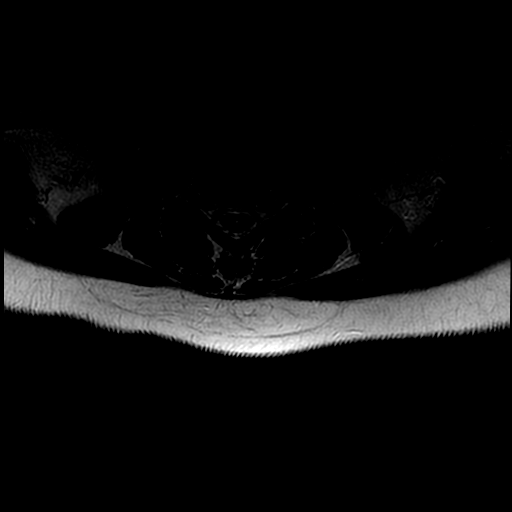
[im 15/35]
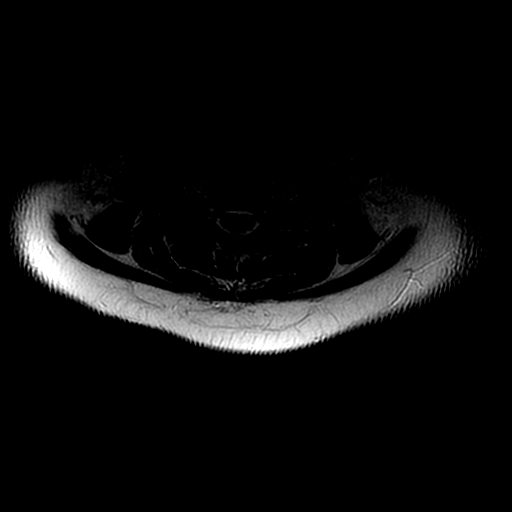
[im 20/35]
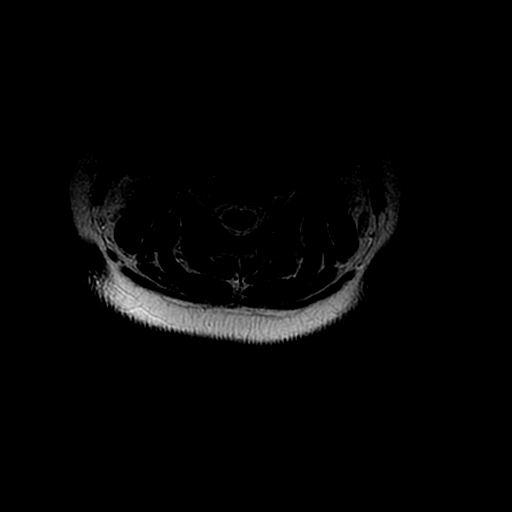
[im 25/35]
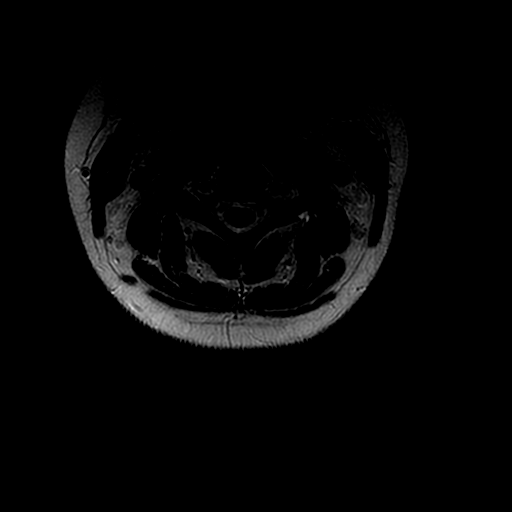
[im 30/35]
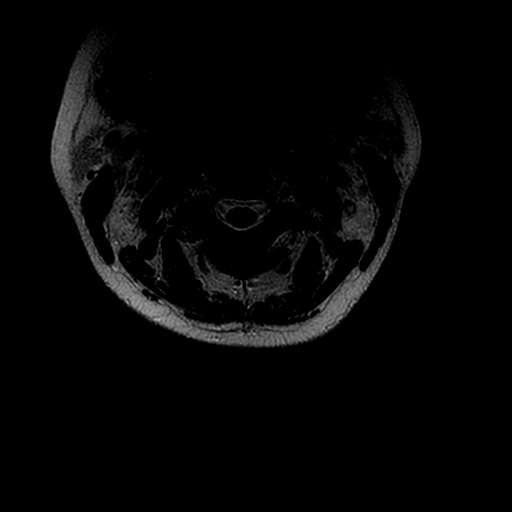
[im 35/35]
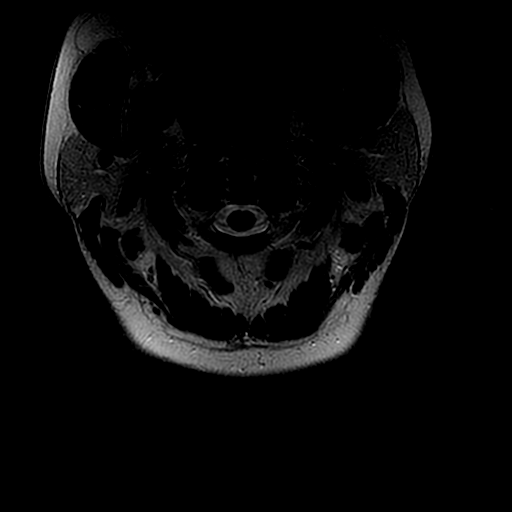

[Series 8: T1 · axial · non-contrast · 3.0mm · 0.39mm/px · z∈[-92,+4]mm · 4 of 35 slices shown (2 of 2)]
[im 1/35]
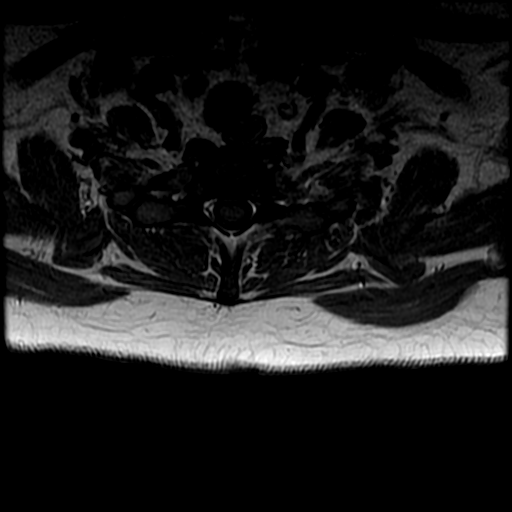
[im 5/35]
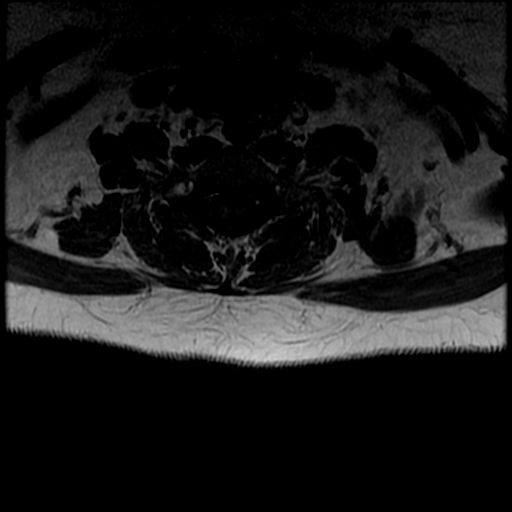
[im 20/35]
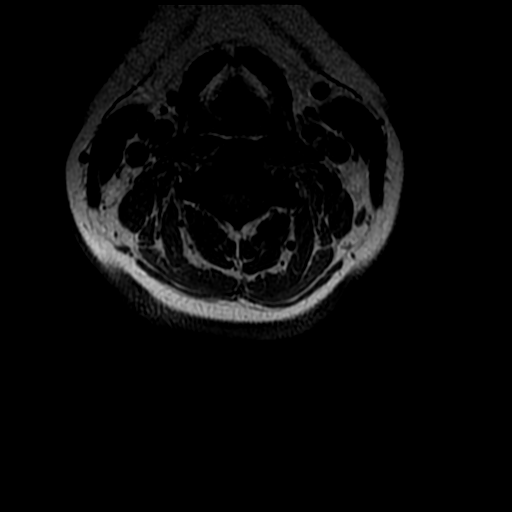
[im 30/35]
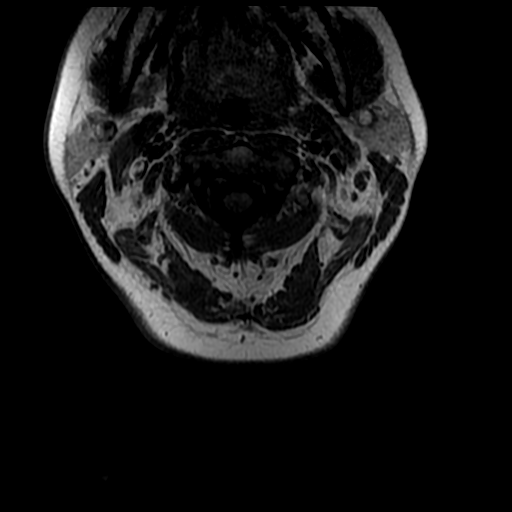

[19 of 48 positions shown; findings below may reference images not displayed]

FINDINGS: Alignment: Normal.

Vertebrae: Normal.

Cord: Normal signal and morphology.

Posterior Fossa, vertebral arteries, paraspinal tissues: Complex 16
mm nodule in the left lobe of the thyroid gland. Otherwise normal.

Disc levels:

Craniocervical junction through C4-5:  Normal.

C5-6: Small broad-based soft disc protrusion asymmetric into the
left lateral recess which is probably affecting the left C6 nerve.
This is best seen on image 24 of series 11.

C6-7 through T2-3:  Normal.
IMPRESSION: 1. Focal soft disc protrusion at C5-6 asymmetric to the left
compressing the left C6 nerve.
2. 16 mm complex nodule in the left lobe of the thyroid gland. I
recommend this be further evaluated by thyroid ultrasound on an
elective basis.

## 2018-02-04 IMAGING — CT CT ANGIO NECK
1 of 7 series · 7 of 33 positions shown · IV contrast (isovue)
Comparison: None.

CLINICAL DATA: LEFT neck pain and LEFT upper extremity weakness.

EXAM:
CT ANGIOGRAPHY NECK
TECHNIQUE: Multidetector CT imaging of the neck was performed using the
standard protocol during bolus administration of intravenous
contrast. Multiplanar CT image reconstructions and MIPs were
obtained to evaluate the vascular anatomy. Carotid stenosis
measurements (when applicable) are obtained utilizing NASCET
criteria, using the distal internal carotid diameter as the
denominator.
CONTRAST:  100 cc Isovue 370

[Series 6: axial · axial · 0.39mm/px · z∈[-316,-140]mm · 7 of 269 slices shown]
[im 34/269  soft-tissue]
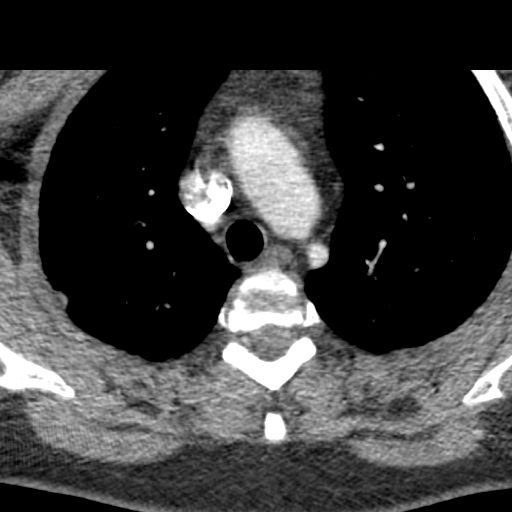
[im 68/269  bone]
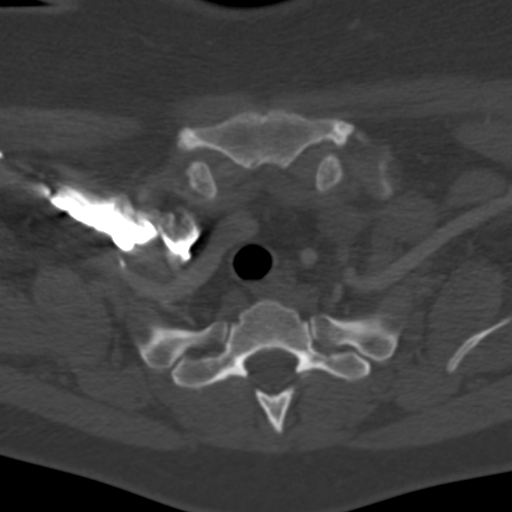
[im 101/269  soft-tissue]
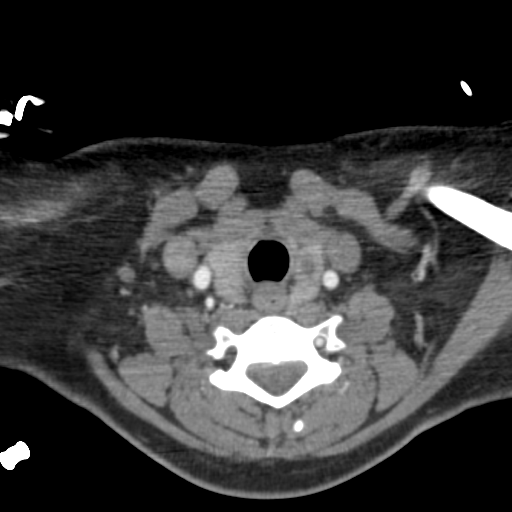
[im 135/269  bone]
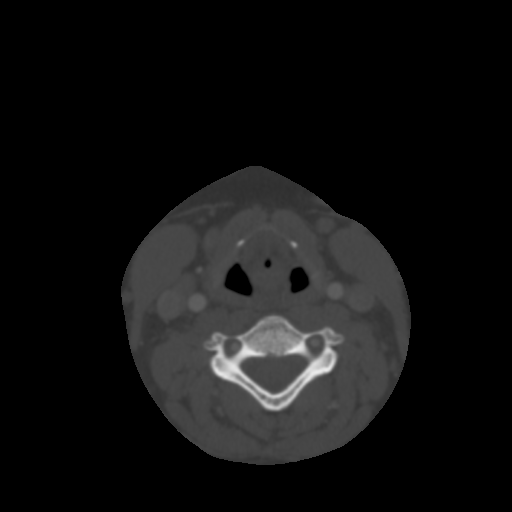
[im 168/269  soft-tissue]
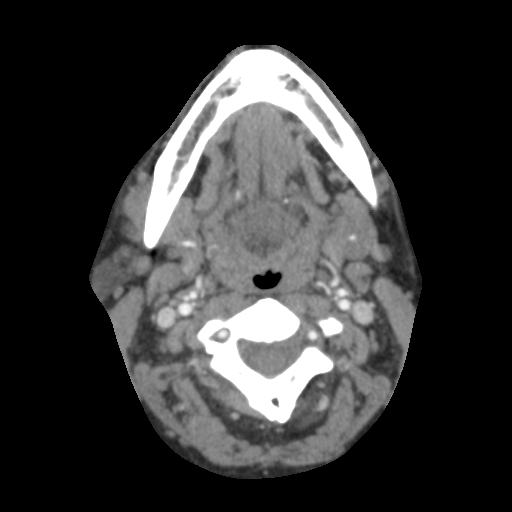
[im 202/269  bone]
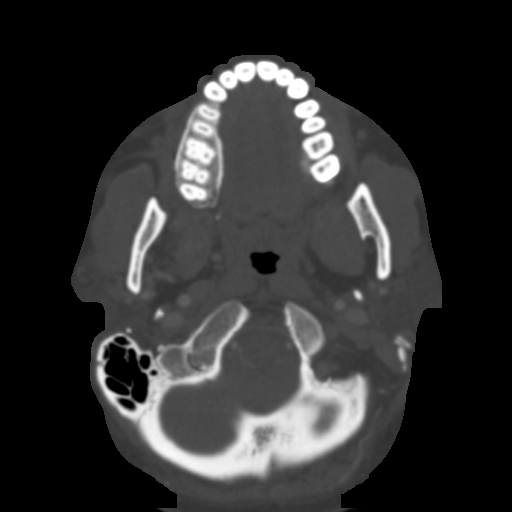
[im 235/269  soft-tissue]
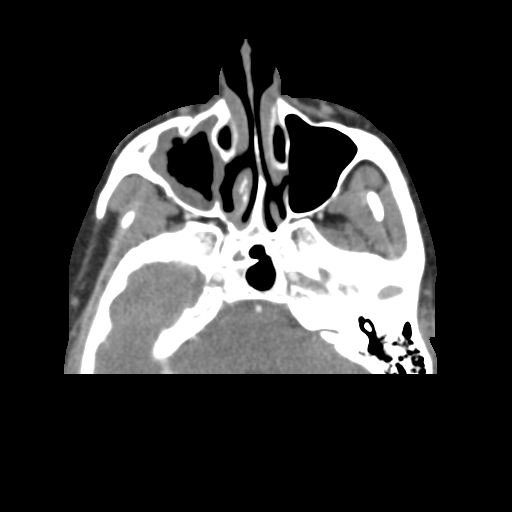

[7 of 33 positions shown; findings below may reference images not displayed]

FINDINGS: AORTIC ARCH: Normal appearance of the thoracic arch, normal branch
pattern. The origins of the innominate, left Common carotid artery
and subclavian artery are widely patent.

RIGHT CAROTID SYSTEM: Common carotid artery is widely patent,
coursing in a straight line fashion. Normal appearance of the
carotid bifurcation without hemodynamically significant stenosis by
NASCET criteria. Normal appearance of the included internal carotid
artery.

LEFT CAROTID SYSTEM: Common carotid artery is widely patent,
coursing in a straight line fashion. Normal appearance of the
carotid bifurcation without hemodynamically significant stenosis by
NASCET criteria. Normal appearance of the included internal carotid
artery.

VERTEBRAL ARTERIES:Codominant vertebral artery's. Normal appearance
of the vertebral arteries, which appear widely patent. RIGHT
vertebral artery remains extraforaminal dishes C4-5, normal variant.

SKELETON: No acute osseous process though bone windows have not been
submitted. No degenerative change of the cervical spine.

OTHER NECK: Soft tissues of the neck are nonacute though, not
tailored for evaluation. Please see CT chest report from same day,
reported separately for dedicated findings. 16 mm LEFT thyroid
nodule.
IMPRESSION: Normal CTA NECK.

**An incidental finding of potential clinical significance has been
found. 19 mm LEFT thyroid nodule for which nonemergent thyroid
sonogram is recommended. This follows ACR consensus guidelines:
Managing Incidental Thyroid Nodules Detected on Imaging: White Paper
of [REDACTED]. [HOSPITAL]

## 2018-02-26 ENCOUNTER — Encounter (HOSPITAL_COMMUNITY): Payer: Self-pay | Admitting: Emergency Medicine

## 2018-02-26 ENCOUNTER — Emergency Department (HOSPITAL_COMMUNITY): Payer: BLUE CROSS/BLUE SHIELD

## 2018-02-26 ENCOUNTER — Emergency Department (HOSPITAL_COMMUNITY)
Admission: EM | Admit: 2018-02-26 | Discharge: 2018-02-26 | Disposition: A | Discharge status: Home / Self Care | Payer: BLUE CROSS/BLUE SHIELD | Attending: Emergency Medicine | Admitting: Emergency Medicine

## 2018-02-26 DIAGNOSIS — M5410 Radiculopathy, site unspecified: Secondary | ICD-10-CM | POA: Diagnosis not present

## 2018-02-26 DIAGNOSIS — M542 Cervicalgia: Secondary | ICD-10-CM | POA: Insufficient documentation

## 2018-02-26 DIAGNOSIS — F17210 Nicotine dependence, cigarettes, uncomplicated: Secondary | ICD-10-CM | POA: Insufficient documentation

## 2018-02-26 DIAGNOSIS — R079 Chest pain, unspecified: Secondary | ICD-10-CM | POA: Diagnosis present

## 2018-02-26 DIAGNOSIS — M79602 Pain in left arm: Secondary | ICD-10-CM | POA: Insufficient documentation

## 2018-02-26 LAB — CBC
HEMATOCRIT: 43.4 % (ref 36.0–46.0)
HEMOGLOBIN: 14 g/dL (ref 12.0–15.0)
MCH: 30.4 pg (ref 26.0–34.0)
MCHC: 32.3 g/dL (ref 30.0–36.0)
MCV: 94.1 fL (ref 78.0–100.0)
Platelets: 379 10*3/uL (ref 150–400)
RBC: 4.61 MIL/uL (ref 3.87–5.11)
RDW: 13.4 % (ref 11.5–15.5)
WBC: 8.2 10*3/uL (ref 4.0–10.5)

## 2018-02-26 LAB — BASIC METABOLIC PANEL
ANION GAP: 7 (ref 5–15)
BUN: 11 mg/dL (ref 6–20)
CALCIUM: 9.3 mg/dL (ref 8.9–10.3)
CHLORIDE: 109 mmol/L (ref 101–111)
CO2: 25 mmol/L (ref 22–32)
Creatinine, Ser: 0.81 mg/dL (ref 0.44–1.00)
GFR calc non Af Amer: >60 mL/min (ref >60)
GLUCOSE: 102 mg/dL — AB (ref 65–99)
POTASSIUM: 4 mmol/L (ref 3.5–5.1)
Sodium: 141 mmol/L (ref 135–145)

## 2018-02-26 LAB — I-STAT BETA HCG BLOOD, ED (MC, WL, AP ONLY): I-stat hCG, quantitative: <5 m[IU]/mL (ref <5)

## 2018-02-26 LAB — I-STAT TROPONIN, ED: Troponin i, poc: 0 ng/mL (ref 0.00–0.08)

## 2018-02-26 MED ORDER — METHOCARBAMOL 500 MG PO TABS
1000.0000 mg | ORAL_TABLET | Freq: Once | ORAL | Status: AC
  Administered 2018-02-26: 1000 mg via ORAL
  Filled 2018-02-26: qty 2

## 2018-02-26 MED ORDER — OXYCODONE-ACETAMINOPHEN 5-325 MG PO TABS
1.0000 | ORAL_TABLET | Freq: Once | ORAL | Status: AC
  Administered 2018-02-26: 1 via ORAL
  Filled 2018-02-26: qty 1

## 2018-02-26 MED ORDER — PREDNISONE 10 MG (21) PO TBPK: ORAL_TABLET | Freq: Every day | ORAL | 0 refills | Status: AC

## 2018-02-26 MED ORDER — ACETAMINOPHEN 325 MG PO TABS: 650.0000 mg | ORAL_TABLET | Freq: Four times a day (QID) | ORAL | 0 refills | Status: AC | PRN

## 2018-02-26 MED ORDER — METHOCARBAMOL 1000 MG/10ML IJ SOLN: 1000.0000 mg | Freq: Once | INTRAMUSCULAR | Status: DC

## 2018-02-26 MED ORDER — IBUPROFEN 400 MG PO TABS: 400.0000 mg | ORAL_TABLET | Freq: Four times a day (QID) | ORAL | 0 refills | Status: AC | PRN

## 2018-02-26 NOTE — ED Provider Notes (Signed)
[facility]-EMERGENCY DEPT Provider Note   CSN: [id number] Arrival date & time: 02/26/18  1301     History   Chief Complaint Chief Complaint  Patient presents with  . Chest Pain  . Neck Pain  . left arm pain    HPI Roberta Hughes is a 32 y.o. female.  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  States it feels like a "crick in my neck".  Describes pain as a pinching feeling.  States pain is rated than 10/10.  Has tried taking Tylenol and aspirin with no relief.  States pain radiates to her left upper back, down her left arm, into the left part of her chest.  States pain is worse with movement.  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  States that since she has been waiting in the waiting room she has had decreased ability to move her left arm due to pain.  Denies any numbness to the left arm.  States that her pain is worse when she takes a deep breath.  Denies any other sxs including no fevers, SOB, abd pain, NVD, headaches.   History reviewed. No pertinent past medical history.  There are no active problems to display for this patient.   Past Surgical History:  Procedure Laterality Date  . arm surgery Left    Nerve blockage     OB History   None      Home Medications    Prior to Admission medications   Medication Sig Start Date End Date Taking? Authorizing Provider  acetaminophen (TYLENOL) 325 MG tablet Take 2 tablets (650 mg total) by mouth every 6 (six) hours as needed. Do not take more than 4000mg  of tylenol per day 02/26/18   Davidson Palmieri S, PA-C  ibuprofen (ADVIL,MOTRIN) 400 MG tablet Take 1 tablet (400 mg total) by mouth every 6 (six) hours as needed. 02/26/18   Tarshia Kot S, PA-C  naproxen (NAPROSYN) 500 MG tablet Take  1 tablet (500 mg total) by mouth 2 (two) times daily. 11/21/16   Arthor Captain, PA-C  oxyCODONE-acetaminophen (PERCOCET) 5-325 MG tablet Take 1 tablet by mouth every 4 (four) hours as needed. 11/21/16   Harris, Abigail, PA-C  predniSONE (STERAPRED UNI-PAK 21 TAB) 10 MG (21) TBPK tablet Take by mouth daily. Take 6 tabs by mouth daily  for 2 days, then 5 tabs for 2 days, then 4 tabs for 2 days, then 3 tabs for 2 days, 2 tabs for 2 days, then 1 tab by mouth daily for 2 days 02/26/18   Landree Fernholz S, PA-C    Family History No family history on file.  Social History Social History   Tobacco Use  . Smoking status: Current Every Day Smoker    Packs/day: 0.50    Last attempt to quit: 11/16/2016    Years since quitting: 1.2  . Smokeless tobacco: Never Used  Substance Use Topics  . Alcohol use: Yes    Comment: occ  . Drug use: Not on file     Allergies   Patient has no known allergies.   Review of Systems Review of Systems  Constitutional: Negative for fever.  HENT: Negative for congestion.   Eyes: Negative for visual disturbance.  Respiratory: Negative for shortness of  breath.   Cardiovascular: Positive for chest pain. Negative for palpitations and leg swelling.  Gastrointestinal: Negative for abdominal pain, constipation, diarrhea, nausea and vomiting.  Genitourinary: Negative for flank pain.  Musculoskeletal: Positive for neck pain.       Left arm pain, left back pain, left neck pain  Skin: Negative for rash.  Neurological: Negative for headaches.     Physical Exam Updated Vital Signs BP (!) 143/79 (BP Location: Right Arm)   Pulse 96   Temp 98.8 F (37.1 C) (Oral)   Resp 20   Ht 5\' 8"  (1.727 m)   Wt 103.9 kg (229 lb)   LMP 02/06/2018   SpO2 100%   BMI 34.82 kg/m   Physical Exam  Constitutional: She appears well-developed and well-nourished.  Non-toxic appearance. She does not appear ill. No distress.  HENT:  Head: Normocephalic and atraumatic.  Neck: Neck  supple.  Pain with rotation to left and with flexion. Mild ttp along c-spine, moderate ttp to left cervical paraspinous muscles.  Cardiovascular: Normal rate, regular rhythm and normal pulses.  No murmur heard. Pulses:      Dorsalis pedis pulses are 2+ on the right side, and 2+ on the left side.       Posterior tibial pulses are 2+ on the right side, and 2+ on the left side.  Pulmonary/Chest: Effort normal and breath sounds normal. No accessory muscle usage. No tachypnea. No respiratory distress. She has no decreased breath sounds. She has no wheezes.  Tenderness to palpation to the left chest wall which reproduces her symptoms  Musculoskeletal:       Right lower leg: Normal. She exhibits no tenderness and no edema.       Left lower leg: Normal. She exhibits no tenderness and no edema.  Neurological: She is alert.  Mental Status:  Alert, thought content appropriate, able to give a coherent history. Speech fluent without evidence of aphasia. Able to follow 2 step commands without difficulty.  Cranial Nerves:  II:  Peripheral visual fields grossly normal, pupils equal, round, reactive to light III,IV, VI: ptosis not present, extra-ocular motions intact bilaterally  V,VII: smile symmetric, facial light touch sensation equal VIII: hearing grossly normal to voice  X: uvula elevates symmetrically  XI: bilateral shoulder shrug symmetric and strong XII: midline tongue extension without fassiculations Motor:  Normal tone. 5/5 strength to RUE and BLE, 5/5 strength to LUE with shoulder abduction/adduction, 3-4/5 strength with shoulder flexion, elbow flexion/extension, grip strength on the left. Pincer strength decreased bilat but symmetric.  Sensory: light touch normal in all extremities. CV: 2+ radial and DP/PT pulses  Skin: Skin is warm and dry. Capillary refill takes less than 2 seconds.  Psychiatric:  anxious  Nursing note and vitals reviewed.    ED Treatments / Results  Labs (all labs  ordered are listed, but only abnormal results are displayed) Labs Reviewed  BASIC METABOLIC PANEL - Abnormal; Notable for the following components:      Result Value   Glucose, Bld 102 (*)    All other components within normal limits  CBC  I-STAT TROPONIN, ED  I-STAT BETA HCG BLOOD, ED ([facility], [facility], [facility] ONLY)    EKG EKG Interpretation  Date/Time:  Sunday February 26 2018 13:15:33 EDT Ventricular Rate:  82 PR Interval:    QRS Duration: 82 QT Interval:  355 QTC Calculation: 415 R Axis:   63 Text Interpretation:  Sinus rhythm Nonspecific T wave abnormality Confirmed by Cathren Laine ([id number]) on 02/26/2018 8:38:48 PM  Radiology Dg Chest 2 View  Result Date: 02/26/2018 CLINICAL DATA:  Chest pain EXAM: CHEST - 2 VIEW COMPARISON:  03/22/2017 FINDINGS: The heart size and mediastinal contours are within normal limits. Both lungs are clear. The visualized skeletal structures are unremarkable. IMPRESSION: No active cardiopulmonary disease. Electronically Signed   By: Marlan Palauharles  Clark M.D.   On: 02/26/2018 13:50    Procedures Procedures (including critical care time)  Medications Ordered in ED Medications  methocarbamol (ROBAXIN) tablet 1,000 mg (1,000 mg Oral Given 02/26/18 2032)  oxyCODONE-acetaminophen (PERCOCET/ROXICET) 5-325 MG per tablet 1 tablet (1 tablet Oral Given 02/26/18 2032)     Initial Impression / Assessment and Plan / ED Course  I have reviewed the triage vital signs and the nursing notes.  Pertinent labs & imaging results that were available during my care of the patient were reviewed by me and considered in my medical decision making (see chart for details).    Discussed pt presentation and exam findings with Dr. Denton LankSteinl, who personally evaluated the patient, workup, and agrees with the plan for discharge with OrthO follow-up to treat radicular symptoms.  Patient did not have any weakness on Dr. Meredith ModyStein O's exam.  Final Clinical Impressions(s) / ED Diagnoses   Final diagnoses:    Radiculopathy, unspecified spinal region   Pt presents to the ER with neck pain that is radiating down arm. There has been no recent trauma and imaging has been completed in the past with soft disc protrusion at C5-6 to the left compressing the left C6 nerve.  Patient does not have significant weakness to the left upper extremity as compared to the right.  She has normal sensation and she is neurovascularly intact.  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  Chest pain is reproducible on palpation.  Troponin negative.  Chest x-ray negative.  ECG with normal sinus rhythm, nonspecific ST wave abnormality, but no findings suggestive of ischemia.  Do not suspect PE or other cardiopulmonary pathology.  Discharge patient home with steroid taper, Tylenol, ibuprofen, recommended warm compresses and referred her to orthopedics for further treatment and evaluation of her symptoms.  Discussed reasons to return immediately to the ER.  All questions answered and patient and her significant other understand the plan.  ED Discharge Orders        Ordered    acetaminophen (TYLENOL) 325 MG tablet  Every 6 hours PRN     02/26/18 2102    ibuprofen (ADVIL,MOTRIN) 400 MG tablet  Every 6 hours PRN     02/26/18 2102    predniSONE (STERAPRED UNI-PAK 21 TAB) 10 MG (21) TBPK tablet  Daily     02/26/18 2103       Karrie MeresCouture, Diogo Anne S, PA-C 02/27/18 0055    Cathren LaineSteinl, Kevin, MD 03/03/18 [phone]

## 2018-02-26 NOTE — Discharge Instructions (Signed)
You are given a narcotic pain medicine in the ER today, he should not drive, work, or operate machinery today now you have received this medication.   You may alternate taking Tylenol and Ibuprofen as needed for pain control. You may take 400-600 mg of ibuprofen every 6 hours and [phone] mg of Tylenol every 6 hours. Do not exceed 4000 mg of Tylenol daily as this can lead to liver damage. Also, make sure to take Ibuprofen with meals as it can cause an upset stomach. Do not take other NSAIDs while taking Ibuprofen such as (Aleve, Naprosyn, Aspirin, Celebrex, etc) and do not take more than the prescribed dose as this can lead to ulcers and bleeding in your GI tract. You may use warm and cold compresses to help with your symptoms.   You were also given a steroid taper to help reduce the inflammation and help with your pain.   Please follow up with your primary doctor within the next 7-10 days for re-evaluation and further treatment of your symptoms.  You are also going a referral to orthopedic doctor and he can follow-up with for further evaluation and treatment of your symptoms.  Please return to the ER sooner if you have any new or worsening symptoms including fevers, weakness or numbness in your arm.

## 2018-02-26 NOTE — ED Triage Notes (Signed)
Pt c/o left side neck pain, let arm pain left chest pain and left upper back pains since last night.  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  Nurse at work gave her ASA and ibuprofen without any relief.

## 2020-02-04 ENCOUNTER — Other Ambulatory Visit: Payer: Self-pay | Admitting: Obstetrics & Gynecology

## 2020-02-04 DIAGNOSIS — N979 Female infertility, unspecified: Secondary | ICD-10-CM

## 2020-02-11 ENCOUNTER — Ambulatory Visit
Admission: RE | Admit: 2020-02-11 | Discharge: 2020-02-11 | Disposition: A | Discharge status: Home / Self Care | Payer: BC Managed Care – PPO | Source: Ambulatory Visit | Attending: Obstetrics & Gynecology | Admitting: Obstetrics & Gynecology

## 2020-02-11 DIAGNOSIS — N979 Female infertility, unspecified: Secondary | ICD-10-CM
